# Patient Record
Sex: Female | Born: 1960 | Race: White | Hispanic: No | Marital: Single | Smoking: Never smoker
Health system: Southern US, Community
[De-identification: ages and names within clinical notes are randomized; demographics above are authoritative.]

## PROBLEM LIST (undated history)

## (undated) DIAGNOSIS — K219 Gastro-esophageal reflux disease without esophagitis: Secondary | ICD-10-CM

## (undated) HISTORY — PX: ABDOMINAL HYSTERECTOMY: SHX81

---

## 2016-06-10 ENCOUNTER — Emergency Department: Payer: 59

## 2016-06-10 ENCOUNTER — Observation Stay
Admission: EM | Admit: 2016-06-10 | Discharge: 2016-06-11 | Disposition: A | Discharge status: Home / Self Care | Payer: 59 | Attending: Internal Medicine | Admitting: Internal Medicine

## 2016-06-10 ENCOUNTER — Encounter: Payer: Self-pay | Admitting: *Deleted

## 2016-06-10 DIAGNOSIS — E785 Hyperlipidemia, unspecified: Secondary | ICD-10-CM | POA: Insufficient documentation

## 2016-06-10 DIAGNOSIS — G43909 Migraine, unspecified, not intractable, without status migrainosus: Secondary | ICD-10-CM

## 2016-06-10 DIAGNOSIS — Z79899 Other long term (current) drug therapy: Secondary | ICD-10-CM | POA: Insufficient documentation

## 2016-06-10 DIAGNOSIS — K219 Gastro-esophageal reflux disease without esophagitis: Secondary | ICD-10-CM | POA: Insufficient documentation

## 2016-06-10 DIAGNOSIS — D72829 Elevated white blood cell count, unspecified: Secondary | ICD-10-CM | POA: Diagnosis not present

## 2016-06-10 DIAGNOSIS — I160 Hypertensive urgency: Secondary | ICD-10-CM

## 2016-06-10 DIAGNOSIS — Z7951 Long term (current) use of inhaled steroids: Secondary | ICD-10-CM | POA: Insufficient documentation

## 2016-06-10 DIAGNOSIS — I674 Hypertensive encephalopathy: Secondary | ICD-10-CM | POA: Diagnosis not present

## 2016-06-10 DIAGNOSIS — G459 Transient cerebral ischemic attack, unspecified: Secondary | ICD-10-CM

## 2016-06-10 DIAGNOSIS — R471 Dysarthria and anarthria: Secondary | ICD-10-CM | POA: Diagnosis not present

## 2016-06-10 DIAGNOSIS — I10 Essential (primary) hypertension: Secondary | ICD-10-CM | POA: Diagnosis present

## 2016-06-10 HISTORY — DX: Gastro-esophageal reflux disease without esophagitis: K21.9

## 2016-06-10 LAB — URINALYSIS COMPLETE WITH MICROSCOPIC (ARMC ONLY)
Bilirubin Urine: NEGATIVE
GLUCOSE, UA: NEGATIVE mg/dL
Hgb urine dipstick: NEGATIVE
KETONES UR: NEGATIVE mg/dL
Leukocytes, UA: NEGATIVE
NITRITE: NEGATIVE
Protein, ur: NEGATIVE mg/dL
SPECIFIC GRAVITY, URINE: 1.008 (ref 1.005–1.030)
pH: 6 (ref 5.0–8.0)

## 2016-06-10 LAB — CBC
HCT: 42 % (ref 35.0–47.0)
Hemoglobin: 14.5 g/dL (ref 12.0–16.0)
MCH: 30.6 pg (ref 26.0–34.0)
MCHC: 34.5 g/dL (ref 32.0–36.0)
MCV: 88.7 fL (ref 80.0–100.0)
Platelets: 249 10*3/uL (ref 150–440)
RBC: 4.73 MIL/uL (ref 3.80–5.20)
RDW: 13.2 % (ref 11.5–14.5)
WBC: 14 10*3/uL — ABNORMAL HIGH (ref 3.6–11.0)

## 2016-06-10 LAB — BASIC METABOLIC PANEL
Anion gap: 8 (ref 5–15)
BUN: 12 mg/dL (ref 6–20)
CALCIUM: 9 mg/dL (ref 8.9–10.3)
CO2: 24 mmol/L (ref 22–32)
Chloride: 108 mmol/L (ref 101–111)
Creatinine, Ser: 0.58 mg/dL (ref 0.44–1.00)
GFR calc Af Amer: >60 mL/min (ref >60)
GLUCOSE: 89 mg/dL (ref 65–99)
Potassium: 3.7 mmol/L (ref 3.5–5.1)
Sodium: 140 mmol/L (ref 135–145)

## 2016-06-10 LAB — GLUCOSE, CAPILLARY: GLUCOSE-CAPILLARY: 85 mg/dL (ref 65–99)

## 2016-06-10 MED ORDER — ACETAMINOPHEN 325 MG PO TABS
650.0000 mg | ORAL_TABLET | Freq: Four times a day (QID) | ORAL | Status: DC | PRN
  Administered 2016-06-10: 650 mg via ORAL
  Filled 2016-06-10: qty 2

## 2016-06-10 MED ORDER — HYDRALAZINE HCL 50 MG PO TABS
25.0000 mg | ORAL_TABLET | Freq: Three times a day (TID) | ORAL | Status: DC
  Administered 2016-06-10 – 2016-06-11 (×2): 25 mg via ORAL
  Filled 2016-06-10 (×2): qty 1

## 2016-06-10 MED ORDER — DOCUSATE SODIUM 100 MG PO CAPS
100.0000 mg | ORAL_CAPSULE | Freq: Two times a day (BID) | ORAL | Status: DC
  Administered 2016-06-11: 09:00:00 100 mg via ORAL
  Filled 2016-06-10 (×2): qty 1

## 2016-06-10 MED ORDER — STROKE: EARLY STAGES OF RECOVERY BOOK
Freq: Once | Status: AC
  Administered 2016-06-11: 1
  Administered 2016-06-11: 07:00:00

## 2016-06-10 MED ORDER — TRAZODONE HCL 50 MG PO TABS: 25.0000 mg | ORAL_TABLET | Freq: Every evening | ORAL | Status: DC | PRN

## 2016-06-10 MED ORDER — LABETALOL HCL 5 MG/ML IV SOLN
10.0000 mg | Freq: Once | INTRAVENOUS | Status: AC
  Administered 2016-06-10: 10 mg via INTRAVENOUS
  Filled 2016-06-10: qty 4

## 2016-06-10 MED ORDER — FLUTICASONE PROPIONATE 50 MCG/ACT NA SUSP
2.0000 | Freq: Every day | NASAL | Status: DC
  Administered 2016-06-11: 2 via NASAL
  Filled 2016-06-10: qty 16

## 2016-06-10 MED ORDER — AMLODIPINE BESYLATE 5 MG PO TABS
5.0000 mg | ORAL_TABLET | Freq: Every day | ORAL | Status: DC
  Administered 2016-06-11: 09:00:00 5 mg via ORAL
  Filled 2016-06-10: qty 1

## 2016-06-10 MED ORDER — BISACODYL 5 MG PO TBEC: 5.0000 mg | DELAYED_RELEASE_TABLET | Freq: Every day | ORAL | Status: DC | PRN

## 2016-06-10 MED ORDER — ONDANSETRON HCL 4 MG PO TABS: 4.0000 mg | ORAL_TABLET | Freq: Four times a day (QID) | ORAL | Status: DC | PRN

## 2016-06-10 MED ORDER — HYDROCODONE-ACETAMINOPHEN 5-325 MG PO TABS: 1.0000 | ORAL_TABLET | ORAL | Status: DC | PRN

## 2016-06-10 MED ORDER — ONDANSETRON HCL 4 MG/2ML IJ SOLN: 4.0000 mg | Freq: Four times a day (QID) | INTRAMUSCULAR | Status: DC | PRN

## 2016-06-10 MED ORDER — ACETAMINOPHEN 650 MG RE SUPP: 650.0000 mg | Freq: Four times a day (QID) | RECTAL | Status: DC | PRN

## 2016-06-10 MED ORDER — FAMOTIDINE 20 MG PO TABS
10.0000 mg | ORAL_TABLET | Freq: Every day | ORAL | Status: DC
  Administered 2016-06-11: 09:00:00 10 mg via ORAL
  Filled 2016-06-10: qty 1

## 2016-06-10 MED ORDER — HEPARIN SODIUM (PORCINE) 5000 UNIT/ML IJ SOLN
5000.0000 [IU] | Freq: Three times a day (TID) | INTRAMUSCULAR | Status: DC
  Administered 2016-06-10 – 2016-06-11 (×2): 5000 [IU] via SUBCUTANEOUS
  Filled 2016-06-10 (×2): qty 1

## 2016-06-10 NOTE — H&P (Signed)
[facility] [place] [facility]   PATIENT NAME: Margaret Griffin Griffin    MR#:  [id number]  DATE OF BIRTH:  06/02/1961  DATE OF ADMISSION:  06/10/2016  [facility]   REQUESTING/REFERRING PHYSICIAN: Dr. Cyril LoosenKinner  CHIEF COMPLAINT:   Chief Complaint  Patient presents with  . Hypertension    HISTORY OF PRESENT ILLNESS:  Margaret Griffin  is a 55 y.o. female with no  Known medical problem comes because of dysarthria.bp was high at home with sbp more than 200/110.she felt very hot. Because of high blood pressure patient's father called EMS. Patient denies any blurred vision, weakness of hands or legs. She got a steroid injection to the neck problems on now Thursday. At that time the blood pressure was 160/80 as per records. Because of elevated blood pressures,  ER physician asked me to admit for possible TIA.   Marland Kitchen.PAST MEDICAL HISTORY:  No past medical history on file.  PAST SURGICAL HISTOIRY:  No past surgical history on file.  SOCIAL HISTORY:   Social History  Substance Use Topics  . Smoking status: Not on file  . Smokeless tobacco: Not on file  . Alcohol use Not on file    FAMILY HISTORY:  No family history on file.  DRUG ALLERGIES:  No Known Allergies  REVIEW OF SYSTEMS:  CONSTITUTIONAL: No fever, fatigue or weakness.  EYES: No blurred or double vision.  EARS, NOSE, AND THROAT: No tinnitus or ear pain.  RESPIRATORY: No cough, shortness of breath, wheezing or hemoptysis.  CARDIOVASCULAR: No chest pain, orthopnea, edema.  GASTROINTESTINAL: No nausea, vomiting, diarrhea or abdominal pain.  GENITOURINARY: No dysuria, hematuria.  ENDOCRINE: No polyuria, nocturia,  HEMATOLOGY: No anemia, easy bruising or bleeding SKIN: No rash or lesion. MUSCULOSKELETAL: No joint pain or arthritis.   NEUROLOGIC: No tingling, numbness, weakness. And noted to have muffled voice as per family PSYCHIATRY: No anxiety or  depression.   MEDICATIONS AT HOME:   Prior to Admission medications   Medication Sig Start Date End Date Taking? Authorizing Provider  fluticasone (FLONASE) 50 MCG/ACT nasal spray Place 2 sprays into the nose daily.   Yes Historical Provider, MD  loratadine (CLARITIN) 10 MG tablet Take 1 tablet by mouth daily.   Yes Historical Provider, MD  MULTIPLE VITAMIN PO Take 1 tablet by mouth daily.   Yes Historical Provider, MD  ranitidine (ZANTAC) 150 MG tablet Take 1 tablet by mouth 2 (two) times daily. 04/11/15  Yes Historical Provider, MD      VITAL SIGNS:  Blood pressure (!) 161/88, pulse 87, temperature 98.3 F (36.8 C), resp. rate (!) 21, height 5\' 7"  (1.702 m), weight 83.5 kg (184 lb), SpO2 96 %.  PHYSICAL EXAMINATION:  GENERAL:  55 y.o.-year-old patient lying in the bed with no acute distress.  EYES: Pupils equal, round, reactive to light and accommodation. No scleral icterus. Extraocular muscles intact.  HEENT: Head atraumatic, normocephalic. Oropharynx and nasopharynx clear.  NECK:  Supple, no jugular venous distention. No thyroid enlargement, no tenderness.  LUNGS: Normal breath sounds bilaterally, no wheezing, rales,rhonchi or crepitation. No use of accessory muscles of respiration.  CARDIOVASCULAR: S1, S2 normal. No murmurs, rubs, or gallops.  ABDOMEN: Soft, nontender, nondistended. Bowel sounds present. No organomegaly or mass.  EXTREMITIES: No pedal edema, cyanosis, or clubbing.  NEUROLOGIC: Cranial nerves II through XII are intact. Muscle strength 5/5 in all extremities. Sensation intact. Gait not checked.  PSYCHIATRIC: The patient is alert and oriented x  3.  SKIN: No obvious rash, lesion, or ulcer.   LABORATORY PANEL:   CBC  Recent Labs Lab 06/10/16 1503  WBC 14.0*  HGB 14.5  HCT 42.0  PLT 249   ------------------------------------------------------------------------------------------------------------------  Chemistries   Recent Labs Lab 06/10/16 1503  NA 140   K 3.7  CL 108  CO2 24  GLUCOSE 89  BUN 12  CREATININE 0.58  CALCIUM 9.0   ------------------------------------------------------------------------------------------------------------------  Cardiac Enzymes No results for input(s): TROPONINI in the last 168 hours. ------------------------------------------------------------------------------------------------------------------  RADIOLOGY:  Ct Head Wo Contrast  Result Date: 06/10/2016 CLINICAL DATA:  Lethargy and weakness EXAM: CT HEAD WITHOUT CONTRAST TECHNIQUE: Contiguous axial images were obtained from the base of the skull through the vertex without intravenous contrast. COMPARISON:  None. FINDINGS: Brain: No evidence of acute infarction, hemorrhage, hydrocephalus, extra-axial collection or mass lesion/mass effect. Vascular: No hyperdense vessel or unexpected calcification. Skull: Normal. Negative for fracture or focal lesion. Sinuses/Orbits: No acute finding. Other: None. IMPRESSION: 1. Normal brain.  No acute intracranial abnormalities. Electronically Signed   By: Signa Kellaylor  Stroud M.D.   On: 06/10/2016 16:49    EKG:   Orders placed or performed during the hospital encounter of 06/10/16  . ED EKG  . ED EKG  . EKG 12-Lead  . EKG 12-Lead   Normal sinus rhythm ST-T changes. 74 bpm.  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 #2 malignant hypertension: Patient is started on Norvasc and hydralazine. MRI of the brain is negative possible discharge tomorrow. Hold off on the echo, carotid ultrasound. I feel that her symptoms are  Due to elevated blood pressure rather than TIA or stroke. #3 dysarthria: Likely secondary to malignant hypertension: Treated. Is better, patient to speech also is a improving.  D/w pt and parents  All  the records are reviewed and case discussed with ED provider. Management plans discussed with the patient, family and they are in agreement.  CODE STATUS:full  TOTAL TIME TAKING CARE OF THIS PATIENT: 55 minutes.    Katha HammingKONIDENA,Tallula Grindle M.D on 06/10/2016 at 8:24 PM  Between 7am to 6pm - Pager - [phone]  After 6pm go to [email or web address] - password [email or web address] [facility]  [facility]  Office  [phone]  CC: Primary care physician; [facility]  Note: This dictation was prepared with Dragon dictation along with smaller phrase technology. Any transcriptional errors that result from this process are unintentional.

## 2016-06-10 NOTE — ED Provider Notes (Signed)
[facility] Emergency Department Provider Note   ____________________________________________    I have reviewed the triage vital signs and the nursing notes.   HISTORY  Chief Complaint Hypertension     HPI Estill CottaSharon Ewton is a 55 y.o. female who presents withcomplaints of high blood pressure. Patient reports she felt very "tired and hot" while visiting her parents, they decided to check her blood pressure and found it to be elevated. At that point she became very fatigued and lightheaded briefly so her father called EMS. No chest pain. No focal weakness, no speech difficulty although mother does note that she was whispering at one point. This episode occurred around noon   No past medical history on file.  There are no active problems to display for this patient.   No past surgical history on file.  Prior to Admission medications   Not on File     Allergies Patient has no known allergies.  No family history on file.  Social History Social History  Substance Use Topics  . Smoking status: Not on file  . Smokeless tobacco: Not on file  . Alcohol use Not on file    Review of Systems  Constitutional: No fever/chills Eyes: No visual changes.   Cardiovascular: Denies chest pain. Respiratory: Denies shortness of breath. Gastrointestinal: No abdominal pain.  No nausea, no vomiting.    Musculoskeletal: Recent cortisone injection for chronic neck pain Skin: Negative for rash. Neurological: Negative for headaches or Focal weakness  10-point ROS otherwise negative.  ____________________________________________   PHYSICAL EXAM:  VITAL SIGNS: ED Triage Vitals  Enc Vitals Group     BP 06/10/16 1449 (!) 192/99     Pulse Rate 06/10/16 1449 75     Resp 06/10/16 1449 16     Temp 06/10/16 1449 98.2 F (36.8 C)     Temp Source 06/10/16 1449 Oral     SpO2 06/10/16 1449 97 %     Weight 06/10/16 1451 184 lb (83.5 kg)     Height  06/10/16 1451 5\' 7"  (1.702 m)     Head Circumference --      Peak Flow --      Pain Score --      Pain Loc --      Pain Edu? --      Excl. in GC? --     Constitutional: Alert and oriented. No acute distress.  Eyes: Conjunctivae are normal. PERRLA, EOMI Head: Atraumatic.  Mouth/Throat: Mucous membranes are moist.   Neck:  Painless ROM, no vertebral tenderness to palpation, no meningismus Cardiovascular: Normal rate, regular rhythm. Grossly normal heart sounds.  Good peripheral circulation. Respiratory: Normal respiratory effort.  No retractions. Lungs CTAB. Gastrointestinal: Soft and nontender. No distention.  No CVA tenderness. Genitourinary: deferred Musculoskeletal: No lower extremity tenderness nor edema.  Warm and well perfused Neurologic:  Normal speech and language. No gross focal neurologic deficits are appreciated. No strength in all extremities, equal. Cranial nerves II-12 are normal Skin:  Skin is warm, dry and intact. No rash noted. Psychiatric: Mood and affect are normal. Speech and behavior are normal.  ____________________________________________   LABS (all labs ordered are listed, but only abnormal results are displayed)  Labs Reviewed  CBC - Abnormal; Notable for the following:       Result Value   WBC 14.0 (*)    All other components within normal limits  URINALYSIS COMPLETEWITH MICROSCOPIC ([facility] ONLY) - Abnormal; Notable for the following:    Color,  Urine STRAW (*)    APPearance CLEAR (*)    Bacteria, UA RARE (*)    Squamous Epithelial / LPF 0-5 (*)    All other components within normal limits  BASIC METABOLIC PANEL  GLUCOSE, CAPILLARY  CBG MONITORING, ED   ____________________________________________  EKG  ED ECG REPORT I, Jene EveryKINNER, Laquasha Groome, the attending physician, personally viewed and interpreted this ECG.  Date: 06/10/2016 EKG Time: 3:55 PM  Rate: 74 Rhythm: normal sinus rhythm QRS Axis: normal Intervals: normal ST/T Wave abnormalities:  normal Conduction Disturbances: none Narrative Interpretation: unremarkable  ____________________________________________  RADIOLOGY  CT head unremarkable ____________________________________________   PROCEDURES  Procedure(s) performed: No    Critical Care performed: No ____________________________________________   INITIAL IMPRESSION / ASSESSMENT AND PLAN / ED COURSE  Pertinent labs & imaging results that were available during my care of the patient were reviewed by me and considered in my medical decision making (see chart for details).  Patient well-appearing and in no acute distress. She is neurologically intact. History of present illness is not consistent with stroke rather more consistent with near syncopal episode but TIA possible. EKG is normal. Neurologic exam is benign. We will obtain labs CT head and reevaluate.  Regardless, given complete resolution of symptoms no indication for TPA.  Clinical Course   ----------------------------------------- 6:41 PM on 06/10/2016 -----------------------------------------  Workup is unremarkable. Patient's blood pressure has improved slightly with labetalol IV. However given her description of difficulty speaking feel admission for MRI is most appropriate ____________________________________________   FINAL CLINICAL IMPRESSION(S) / ED DIAGNOSES  Final diagnoses:  Hypertensive urgency  Transient cerebral ischemia, unspecified type      NEW MEDICATIONS STARTED DURING THIS VISIT:  New Prescriptions   No medications on file     Note:  This document was prepared using Dragon voice recognition software and may include unintentional dictation errors.    Jene Everyobert Lusia Greis, MD 06/10/16 [phone]

## 2016-06-10 NOTE — ED Triage Notes (Signed)
Pt arrived via EMS from parent's house with c/o feeling tired and lethargic. When EMS arrived BP was elevated with systolic in the 200s and diastolic in the 100s.  Patient has no past hx of hypertension and is not on any medications. Pt recently had cortisone injection in neck on Wednesday and her BP was elevated then at 160s over 90s..  Pt reports she has periodic headaches and has hx of migraine. Denies any vision changes or headaches. Pt is speaking very softly and is staring frequently. Pt assisted to bathroom and was able to talk and ambulate without difficulty. Family at bedside states around 1300 pt was c/o feeling hot and not long after became lethargic.

## 2016-06-10 NOTE — ED Notes (Signed)
Patient called out asking for something for a headache, paged Dr. Luberta MutterKonidena.

## 2016-06-11 ENCOUNTER — Observation Stay: Payer: 59

## 2016-06-11 DIAGNOSIS — D72829 Elevated white blood cell count, unspecified: Secondary | ICD-10-CM

## 2016-06-11 DIAGNOSIS — G43909 Migraine, unspecified, not intractable, without status migrainosus: Secondary | ICD-10-CM

## 2016-06-11 DIAGNOSIS — I674 Hypertensive encephalopathy: Secondary | ICD-10-CM

## 2016-06-11 DIAGNOSIS — I10 Essential (primary) hypertension: Secondary | ICD-10-CM

## 2016-06-11 DIAGNOSIS — E785 Hyperlipidemia, unspecified: Secondary | ICD-10-CM

## 2016-06-11 LAB — BASIC METABOLIC PANEL
ANION GAP: 8 (ref 5–15)
BUN: 13 mg/dL (ref 6–20)
CO2: 24 mmol/L (ref 22–32)
Calcium: 8.7 mg/dL — ABNORMAL LOW (ref 8.9–10.3)
Chloride: 105 mmol/L (ref 101–111)
Creatinine, Ser: 0.46 mg/dL (ref 0.44–1.00)
GFR calc Af Amer: >60 mL/min (ref >60)
Glucose, Bld: 107 mg/dL — ABNORMAL HIGH (ref 65–99)
POTASSIUM: 3.7 mmol/L (ref 3.5–5.1)
SODIUM: 137 mmol/L (ref 135–145)

## 2016-06-11 LAB — LIPID PANEL
Cholesterol: 186 mg/dL (ref 0–200)
HDL: 49 mg/dL (ref >40)
LDL CALC: 103 mg/dL — AB (ref 0–99)
TRIGLYCERIDES: 172 mg/dL — AB (ref <150)
Total CHOL/HDL Ratio: 3.8 RATIO
VLDL: 34 mg/dL (ref 0–40)

## 2016-06-11 LAB — URINE DRUG SCREEN, QUALITATIVE (ARMC ONLY)
AMPHETAMINES, UR SCREEN: NOT DETECTED
BENZODIAZEPINE, UR SCRN: NOT DETECTED
Barbiturates, Ur Screen: NOT DETECTED
Cannabinoid 50 Ng, Ur ~~LOC~~: NOT DETECTED
Cocaine Metabolite,Ur ~~LOC~~: NOT DETECTED
MDMA (ECSTASY) UR SCREEN: NOT DETECTED
METHADONE SCREEN, URINE: NOT DETECTED
OPIATE, UR SCREEN: NOT DETECTED
PHENCYCLIDINE (PCP) UR S: NOT DETECTED
Tricyclic, Ur Screen: NOT DETECTED

## 2016-06-11 LAB — CBC
HEMATOCRIT: 43.1 % (ref 35.0–47.0)
HEMOGLOBIN: 14.4 g/dL (ref 12.0–16.0)
MCH: 29.8 pg (ref 26.0–34.0)
MCHC: 33.5 g/dL (ref 32.0–36.0)
MCV: 88.9 fL (ref 80.0–100.0)
Platelets: 271 10*3/uL (ref 150–440)
RBC: 4.85 MIL/uL (ref 3.80–5.20)
RDW: 12.8 % (ref 11.5–14.5)
WBC: 13.7 10*3/uL — AB (ref 3.6–11.0)

## 2016-06-11 LAB — GLUCOSE, CAPILLARY: GLUCOSE-CAPILLARY: 115 mg/dL — AB (ref 65–99)

## 2016-06-11 LAB — TROPONIN I: Troponin I: <0.03 ng/mL (ref <0.03)

## 2016-06-11 MED ORDER — AMLODIPINE BESYLATE 5 MG PO TABS
5.0000 mg | ORAL_TABLET | Freq: Once | ORAL | Status: AC
  Administered 2016-06-11: 5 mg via ORAL
  Filled 2016-06-11: qty 1

## 2016-06-11 MED ORDER — AMLODIPINE BESYLATE 10 MG PO TABS: 10.0000 mg | ORAL_TABLET | Freq: Every day | ORAL | Status: DC

## 2016-06-11 MED ORDER — SODIUM CHLORIDE 0.9% FLUSH: 3.0000 mL | INTRAVENOUS | Status: DC | PRN

## 2016-06-11 MED ORDER — AMLODIPINE BESYLATE 10 MG PO TABS
10.0000 mg | ORAL_TABLET | Freq: Every day | ORAL | 5 refills | Status: AC
Start: 2016-06-12 — End: ?

## 2016-06-11 MED ORDER — SODIUM CHLORIDE 0.9% FLUSH
3.0000 mL | Freq: Two times a day (BID) | INTRAVENOUS | Status: DC
Start: 2016-06-11 — End: 2016-06-11
  Administered 2016-06-11: 09:00:00 3 mL via INTRAVENOUS

## 2016-06-11 NOTE — Progress Notes (Signed)
Dr. Winona LegatoVaickute in with MRI order reviewed- will change to routine and MRI staff to be called and notified per MD. TC to MRI tech on call and advised- will be in this morning.  Pt and family updated.

## 2016-06-11 NOTE — Progress Notes (Signed)
MRI negative; carotid US done, troponin's stable/unchanged. Additional norvasc 5 mg po given with med education done. Oral and written hypertension, heart healthy diet, exercise education done stressing signs/symptoms to report, ER interventions done with stated understanding/teach back with sister and mother involved. Oral and written AVS done with pt and sister, norvasc rx given to pt.  Discharged home to self care in care of sister.

## 2016-06-11 NOTE — Discharge Instructions (Signed)
Hypertension Hypertension, commonly called high blood pressure, is when the force of blood pumping through your arteries is too strong. Your arteries are the blood vessels that carry blood from your heart throughout your body. A blood pressure reading consists of a higher number over a lower number, such as 110/72. The higher number (systolic) is the pressure inside your arteries when your heart pumps. The lower number (diastolic) is the pressure inside your arteries when your heart relaxes. Ideally you want your blood pressure below 120/80. Hypertension forces your heart to work harder to pump blood. Your arteries may become narrow or stiff. Having untreated or uncontrolled hypertension can cause heart attack, stroke, kidney disease, and other problems. RISK FACTORS Some risk factors for high blood pressure are controllable. Others are not.  Risk factors you cannot control include:   Race. You may be at higher risk if you are African American.  Age. Risk increases with age.  Gender. Men are at higher risk than women before age 45 years. After age 65, women are at higher risk than men. Risk factors you can control include:  Not getting enough exercise or physical activity.  Being overweight.  Getting too much fat, sugar, calories, or salt in your diet.  Drinking too much alcohol. SIGNS AND SYMPTOMS Hypertension does not usually cause signs or symptoms. Extremely high blood pressure (hypertensive crisis) may cause headache, anxiety, shortness of breath, and nosebleed. DIAGNOSIS To check if you have hypertension, your health care provider will measure your blood pressure while you are seated, with your arm held at the level of your heart. It should be measured at least twice using the same arm. Certain conditions can cause a difference in blood pressure between your right and left arms. A blood pressure reading that is higher than normal on one occasion does not mean that you need treatment. If  it is not clear whether you have high blood pressure, you may be asked to return on a different day to have your blood pressure checked again. Or, you may be asked to monitor your blood pressure at home for 1 or more weeks. TREATMENT Treating high blood pressure includes making lifestyle changes and possibly taking medicine. Living a healthy lifestyle can help lower high blood pressure. You may need to change some of your habits. Lifestyle changes may include:  Following the DASH diet. This diet is high in fruits, vegetables, and whole grains. It is low in salt, red meat, and added sugars.  Keep your sodium intake below 2,300 mg per day.  Getting at least 30-45 minutes of aerobic exercise at least 4 times per week.  Losing weight if necessary.  Not smoking.  Limiting alcoholic beverages.  Learning ways to reduce stress. Your health care provider may prescribe medicine if lifestyle changes are not enough to get your blood pressure under control, and if one of the following is true:  You are 18-59 years of age and your systolic blood pressure is above 140.  You are 60 years of age or older, and your systolic blood pressure is above 150.  Your diastolic blood pressure is above 90.  You have diabetes, and your systolic blood pressure is over 140 or your diastolic blood pressure is over 90.  You have kidney disease and your blood pressure is above 140/90.  You have heart disease and your blood pressure is above 140/90. Your personal target blood pressure may vary depending on your medical conditions, your age, and other factors. HOME CARE INSTRUCTIONS    Have your blood pressure rechecked as directed by your health care provider.   Take medicines only as directed by your health care provider. Follow the directions carefully. Blood pressure medicines must be taken as prescribed. The medicine does not work as well when you skip doses. Skipping doses also puts you at risk for  problems.  Do not smoke.   Monitor your blood pressure at home as directed by your health care provider. SEEK MEDICAL CARE IF:   You think you are having a reaction to medicines taken.  You have recurrent headaches or feel dizzy.  You have swelling in your ankles.  You have trouble with your vision. SEEK IMMEDIATE MEDICAL CARE IF:  You develop a severe headache or confusion.  You have unusual weakness, numbness, or feel faint.  You have severe chest or abdominal pain.  You vomit repeatedly.  You have trouble breathing. MAKE SURE YOU:   Understand these instructions.  Will watch your condition.  Will get help right away if you are not doing well or get worse.   This information is not intended to replace advice given to you by your health care provider. Make sure you discuss any questions you have with your health care provider.   Document Released: 07/17/2005 Document Revised: 12/01/2014 Document Reviewed: 05/09/2013 Elsevier Interactive Patient Education 2016 [employer]  

## 2016-06-11 NOTE — Progress Notes (Signed)
Noted stat MRI not done. TC with voice mail message left for MRI in regards to this and contact nurse with plans. Pt is stable with no co's currently.

## 2016-06-11 NOTE — Discharge Summary (Signed)
[facility] [place] [facility]   PATIENT NAME: Margaret Griffin    MR#:  [id number]  DATE OF BIRTH:  12/07/1960  DATE OF ADMISSION:  06/10/2016 ADMITTING PHYSICIAN: Katha HammingSnehalatha Konidena, MD  DATE OF DISCHARGE: 06/11/2016  1:18 PM  [facility]     ADMISSION DIAGNOSIS:  TIA (transient ischemic attack) [G45.9] Dysarthria [R47.1] Hypertensive urgency [I16.0] Transient cerebral ischemia, unspecified type [G45.9]  DISCHARGE DIAGNOSIS:  Principal Problem:   Dysarthria Active Problems:   Encephalopathy, hypertensive   Malignant essential hypertension   Hyperlipidemia   Migraine   Leukocytosis   SECONDARY DIAGNOSIS:   Past Medical History:  Diagnosis Date  . GERD (gastroesophageal reflux disease)     .pro HOSPITAL COURSE:   The patient is 55 year old Caucasian female with past history significant for history of gastroesophageal reflux disease who presents to the hospital with complaints of confusion, speech disturbance, slurring of speech, feeling hot. She was noted to have marked highly elevated blood pressure of 200/110 and admitted to the hospital. She denied blurry vision, weakness or numbness in her arms or legs. She was admitted to the hospital and initiated on Norvasc with improvement of her blood pressure. With this, her condition improved, including her speech. On the day of discharge she was back to baseline, per family members. While in the hospital patient had carotid ultrasound, unremarkable, but right ICA origin, there was no evidence of left ICA stenosis, antegrade flow was noted in left and the right vertebral arteries. Brain MRI was normal study for age, few punctate white matter foci were felt to be not likely of clinical relevance. Head CT on admission was normal. It was felt that patient did not have TIA or stroke, but rather hypertensive encephalopathy. She was advised to use aspirin  therapy intermittently, especially with her migraine headaches, and she was initiated on Norvasc, which was advanced to 10 mg daily dose. She was advised to follow up with her primary care physician as outpatient within 1 week after discharge . Discussion by problem: #1, dysarthria, confusion, no obvious stroke on MRI of the brain, symptoms resolved with resolution of markedly elevated blood pressure. No other symptoms were reported, making it  unlikely to be TIA, but rather hypertensive encephalopathy. Patient was advised to initiate aspirin therapy, continue blood pressure monitoring. #2. Malignant essential hypertension, resolved with Norvasc, follow-up as outpatient, advanced medications as needed, patient was counseled about sodium intake and exercise #3. Hyperlipidemia, LDL of 103. Patient was advised to lose weight, exercise, and it is recommended. However, to recheck patient's lipid panel as outpatient #4. Leukocytosis, likely stress related, recheck as outpatient, no signs of infection were noted during her stay in the hospital time, urinalysis was normal  , Stable   CONSULTS OBTAINED:    DRUG ALLERGIES:  No Known Allergies  DISCHARGE MEDICATIONS:   Discharge Medication List as of 06/11/2016 12:04 PM    START taking these medications   Details  amLODipine (NORVASC) 10 MG tablet Take 1 tablet (10 mg total) by mouth daily., Starting Mon 06/12/2016, Normal      CONTINUE these medications which have NOT CHANGED   Details  fluticasone (FLONASE) 50 MCG/ACT nasal spray Place 2 sprays into the nose daily., Historical Med    loratadine (CLARITIN) 10 MG tablet Take 1 tablet by mouth daily., Historical Med    MULTIPLE VITAMIN PO Take 1 tablet by mouth daily., Historical Med    ranitidine (ZANTAC) 150 MG tablet  Take 1 tablet by mouth 2 (two) times daily., Starting Sun 04/11/2015, Historical Med         DISCHARGE INSTRUCTIONS:    Patient is to follow-up with primary care physician  within one week after discharge   If you experience worsening of your admission symptoms, develop shortness of breath, life threatening emergency, suicidal or homicidal thoughts you must seek medical attention immediately by calling 911 or calling your MD immediately  if symptoms less severe.  You Must read complete instructions/literature along with all the possible adverse reactions/side effects for all the Medicines you take and that have been prescribed to you. Take any new Medicines after you have completely understood and accept all the possible adverse reactions/side effects.   Please note  You were cared for by a hospitalist during your hospital stay. If you have any questions about your discharge medications or the care you received while you were in the hospital after you are discharged, you can call the unit and asked to speak with the hospitalist on call if the hospitalist that took care of you is not available. Once you are discharged, your primary care physician will handle any further medical issues. Please note that NO REFILLS for any discharge medications will be authorized once you are discharged, as it is imperative that you return to your primary care physician (or establish a relationship with a primary care physician if you do not have one) for your aftercare needs so that they can reassess your need for medications and monitor your lab values.    Today   CHIEF COMPLAINT:   Chief Complaint  Patient presents with  . Hypertension    HISTORY OF PRESENT ILLNESS:  Margaret CottaSharon Griffin  is a 55 y.o. female with a known history of gastroesophageal reflux disease who presents to the hospital with complaints of confusion, speech disturbance, slurring of speech, feeling hot. She was noted to have marked highly elevated blood pressure of 200/110 and admitted to the hospital. She denied blurry vision, weakness or numbness in her arms or legs. She was admitted to the hospital and initiated  on Norvasc with improvement of her blood pressure. With this, her condition improved, including her speech. On the day of discharge she was back to baseline, per family members. While in the hospital patient had carotid ultrasound, unremarkable, but right ICA origin, there was no evidence of left ICA stenosis, antegrade flow was noted in left and the right vertebral arteries. Brain MRI was normal study for age, few punctate white matter foci were felt to be not likely of clinical relevance. Head CT on admission was normal. It was felt that patient did not have TIA or stroke, but rather hypertensive encephalopathy. She was advised to use aspirin therapy intermittently, especially with her migraine headaches, and she was initiated on Norvasc, which was advanced to 10 mg daily dose. She was advised to follow up with her primary care physician as outpatient within 1 week after discharge . Discussion by problem: #1, dysarthria, confusion, no obvious stroke on MRI of the brain, symptoms resolved with resolution of markedly elevated blood pressure. No other symptoms were reported, making it  unlikely to be TIA, but rather hypertensive encephalopathy. Patient was advised to initiate aspirin therapy, continue blood pressure monitoring. #2. Malignant essential hypertension, resolved with Norvasc, follow-up as outpatient, advanced medications as needed, patient was counseled about sodium intake and exercise #3. Hyperlipidemia, LDL of 103. Patient was advised to lose weight, exercise, and it is recommended. However,  to recheck patient's lipid panel as outpatient #4. Leukocytosis, likely stress related, recheck as outpatient, no signs of infection were noted during her stay in the hospital time, urinalysis was normal    VITAL SIGNS:  Blood pressure (!) 156/81, pulse 87, temperature 98 F (36.7 C), temperature source Oral, resp. rate 20, height 5\' 7"  (1.702 m), weight 82.3 kg (181 lb 6.4 oz), SpO2 96 %.  I/O:    Intake/Output Summary (Last 24 hours) at 06/11/16 1351 Last data filed at 06/11/16 0800  Gross per 24 hour  Intake              120 ml  Output                0 ml  Net              120 ml    PHYSICAL EXAMINATION:  GENERAL:  55 y.o.-year-old patient lying in the bed with no acute distress.  EYES: Pupils equal, round, reactive to light and accommodation. No scleral icterus. Extraocular muscles intact.  HEENT: Head atraumatic, normocephalic. Oropharynx and nasopharynx clear.  NECK:  Supple, no jugular venous distention. No thyroid enlargement, no tenderness.  LUNGS: Normal breath sounds bilaterally, no wheezing, rales,rhonchi or crepitation. No use of accessory muscles of respiration.  CARDIOVASCULAR: S1, S2 normal. No murmurs, rubs, or gallops.  ABDOMEN: Soft, non-tender, non-distended. Bowel sounds present. No organomegaly or mass.  EXTREMITIES: No pedal edema, cyanosis, or clubbing.  NEUROLOGIC: Cranial nerves II through XII are intact. Muscle strength 5/5 in all extremities. Sensation intact. Gait not checked.  PSYCHIATRIC: The patient is alert and oriented x 3.  SKIN: No obvious rash, lesion, or ulcer.   DATA REVIEW:   CBC  Recent Labs Lab 06/11/16 0024  WBC 13.7*  HGB 14.4  HCT 43.1  PLT 271    Chemistries   Recent Labs Lab 06/11/16 0024  NA 137  K 3.7  CL 105  CO2 24  GLUCOSE 107*  BUN 13  CREATININE 0.46  CALCIUM 8.7*    Cardiac Enzymes  Recent Labs Lab 06/11/16 1135  TROPONINI <0.03    Microbiology Results  No results found for this or any previous visit.  RADIOLOGY:  Ct Head Wo Contrast  Result Date: 06/10/2016 CLINICAL DATA:  Lethargy and weakness EXAM: CT HEAD WITHOUT CONTRAST TECHNIQUE: Contiguous axial images were obtained from the base of the skull through the vertex without intravenous contrast. COMPARISON:  None. FINDINGS: Brain: No evidence of acute infarction, hemorrhage, hydrocephalus, extra-axial collection or mass lesion/mass  effect. Vascular: No hyperdense vessel or unexpected calcification. Skull: Normal. Negative for fracture or focal lesion. Sinuses/Orbits: No acute finding. Other: None. IMPRESSION: 1. Normal brain.  No acute intracranial abnormalities. Electronically Signed   By: Signa Kell M.D.   On: 06/10/2016 16:49   Mr Brain Wo Contrast  Result Date: 06/11/2016 CLINICAL DATA:  Acute onset of dysarthria.  Hypertension. EXAM: MRI HEAD WITHOUT CONTRAST TECHNIQUE: Multiplanar, multiecho pulse sequences of the brain and surrounding structures were obtained without intravenous contrast. COMPARISON:  Head CT 06/10/2016 FINDINGS: Brain: Diffusion imaging does not show any acute or subacute infarction. The brainstem and cerebellum are normal. Cerebral hemispheres are normal except for a few punctate foci of T2 and FLAIR signal within the frontal white matter, not likely of clinical significance. No cortical or large vessel territory infarction. No mass lesion, hemorrhage, hydrocephalus or extra-axial collection. No pituitary mass. Vascular: Major vessels at the base of the brain show flow. Skull and  upper cervical spine: Negative Sinuses/Orbits: Clear/normal Other: None significant IMPRESSION: Normal study for age. Few punctate white matter foci not likely of clinical relevance. Electronically Signed   By: Paulina Fusi M.D.   On: 06/11/2016 10:57   US Carotid Bilateral  Result Date: 06/11/2016 CLINICAL DATA:  Dysarthria, lethargy, weakness and TIA. Hypertensive. EXAM: BILATERAL CAROTID DUPLEX ULTRASOUND TECHNIQUE: Wallace Cullens scale imaging, color Doppler and duplex ultrasound were performed of bilateral carotid and vertebral arteries in the neck. COMPARISON:  None. FINDINGS: Criteria: Quantification of carotid stenosis is based on velocity parameters that correlate the residual internal carotid diameter with NASCET-based stenosis levels, using the diameter of the distal internal carotid lumen as the denominator for stenosis  measurement. The following velocity measurements were obtained: RIGHT ICA:  79/27 cm/sec CCA:  80/12 cm/sec SYSTOLIC ICA/CCA RATIO:  1.0 DIASTOLIC ICA/CCA RATIO:  2.3 ECA:  120 cm/sec LEFT ICA:  76/26 cm/sec CCA:  89/14 cm/sec SYSTOLIC ICA/CCA RATIO:  0.9 DIASTOLIC ICA/CCA RATIO:  1.9 ECA:  126 cm/sec RIGHT CAROTID ARTERY: Mild amount of partially calcified plaque is present at the level of the carotid bulb and extending just into the right ICA origin. No significant velocity elevation present with estimated right ICA stenosis of less than 50%. RIGHT VERTEBRAL ARTERY: Antegrade flow with normal waveform and velocity. LEFT CAROTID ARTERY: Mild amount of plaque present in the carotid bulb. The left ICA demonstrates no evidence of plaque or stenosis with normal velocities present. There is no evidence of left ICA stenosis. LEFT VERTEBRAL ARTERY: Antegrade flow with normal waveform and velocity. IMPRESSION: Mild plaque in both carotid bulbs and at the right ICA origin. No significant carotid stenosis is identified. Right ICA stenosis is estimated at less than 50%. There is no evidence of left ICA stenosis. Electronically Signed   By: Irish Lack M.D.   On: 06/11/2016 11:03    EKG:   Orders placed or performed during the hospital encounter of 06/10/16  . ED EKG  . ED EKG  . EKG 12-Lead  . EKG 12-Lead      Management plans discussed with the patient, family and they are in agreement.  CODE STATUS:     Code Status Orders        Start     Ordered   06/10/16 2332  Full code  Continuous     06/10/16 2333    Code Status History    Date Active Date Inactive Code Status Order ID Comments User Context   06/10/2016  7:57 PM 06/10/2016 11:33 PM Full Code [id number]  Katha Hamming, MD ED      TOTAL TIME TAKING CARE OF THIS PATIENT: .    Katharina Caper M.D on 06/11/2016 at 1:51 PM  Between 7am to 6pm - Pager - [phone]  After 6pm go to [email or web address] - password [email or web address]  [facility]  [place] [facility]  Office  [phone]  CC: Primary care physician; [facility]

## 2016-06-12 LAB — HEMOGLOBIN A1C
HEMOGLOBIN A1C: 5.6 % (ref 4.8–5.6)
Hgb A1c MFr Bld: 5.6 % (ref 4.8–5.6)
Mean Plasma Glucose: 114 mg/dL
Mean Plasma Glucose: 114 mg/dL

## 2017-10-12 IMAGING — US US CAROTID DUPLEX BILAT
1 series · 13 of 24 positions shown · non-contrast
Comparison: None.

CLINICAL DATA: Dysarthria, lethargy, weakness and TIA.
Hypertensive.

EXAM:
BILATERAL CAROTID DUPLEX ULTRASOUND
TECHNIQUE: Gray scale imaging, color Doppler and duplex ultrasound were
performed of bilateral carotid and vertebral arteries in the neck.

[Series 1: us carotid duplex bilat · 13 of 62 slices shown]
[im 1/62]
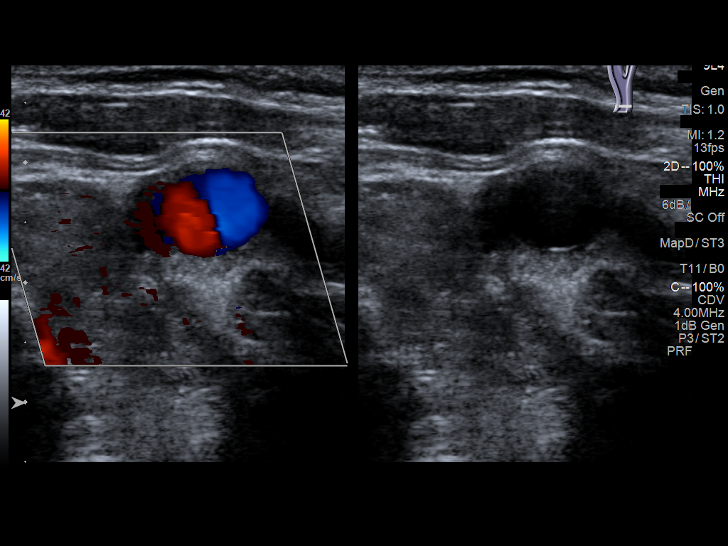
[im 6/62]
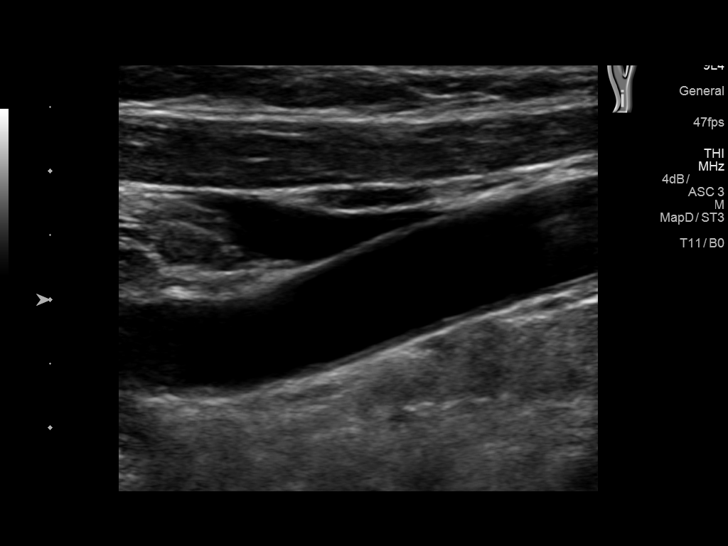
[im 11/62]
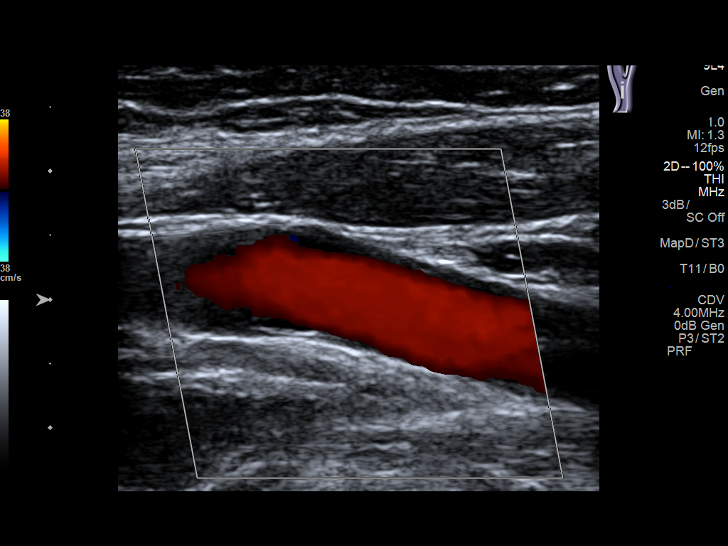
[im 16/62]
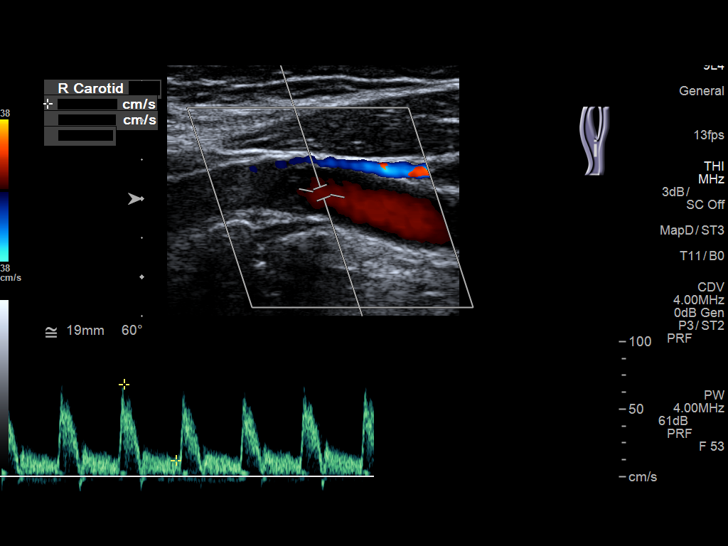
[im 22/62]
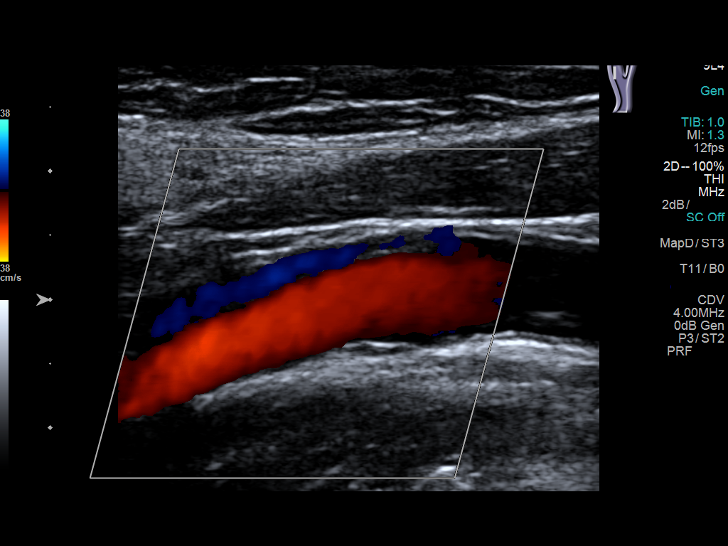
[im 27/62]
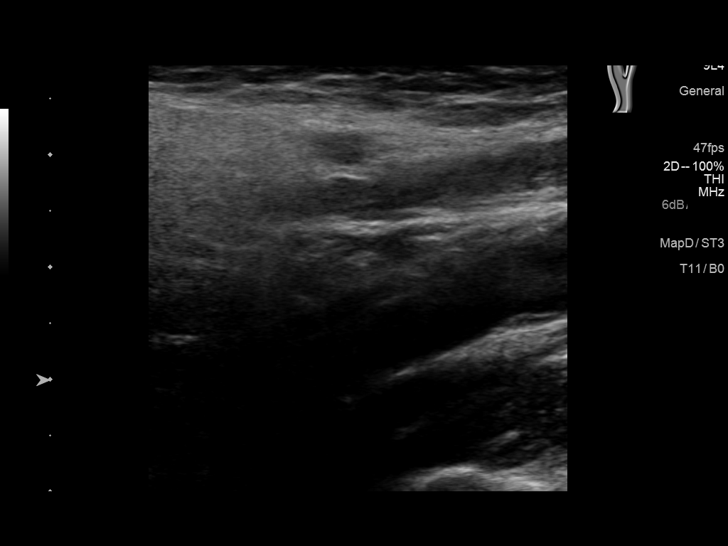
[im 32/62]
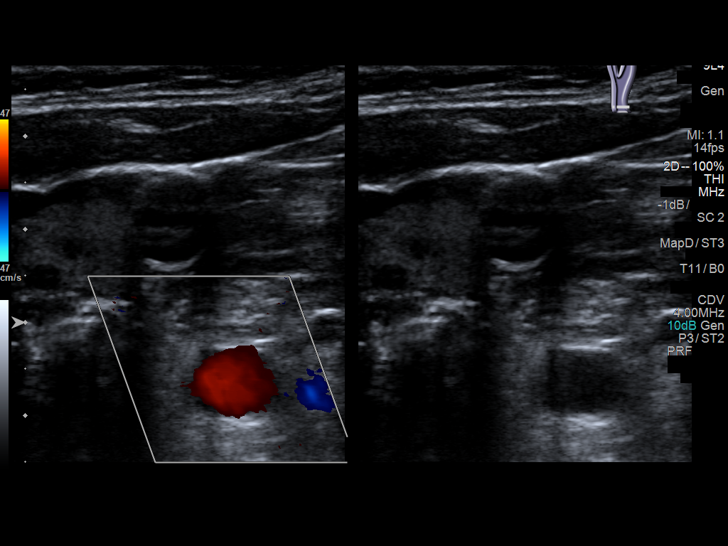
[im 35/62]
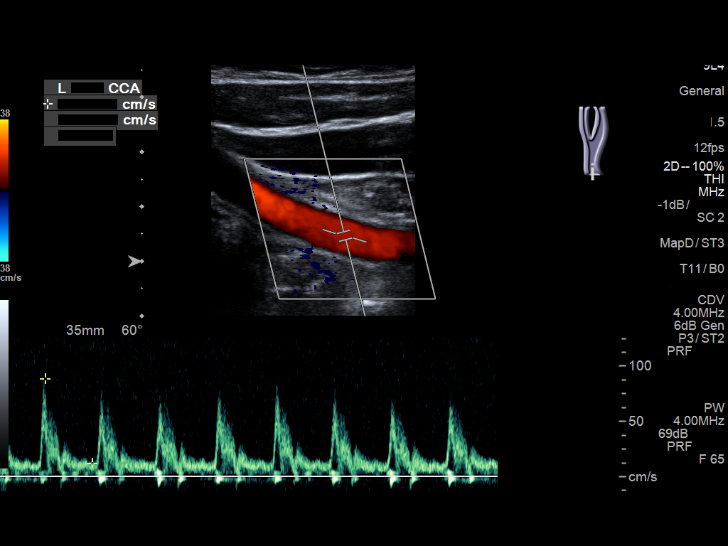
[im 40/62]
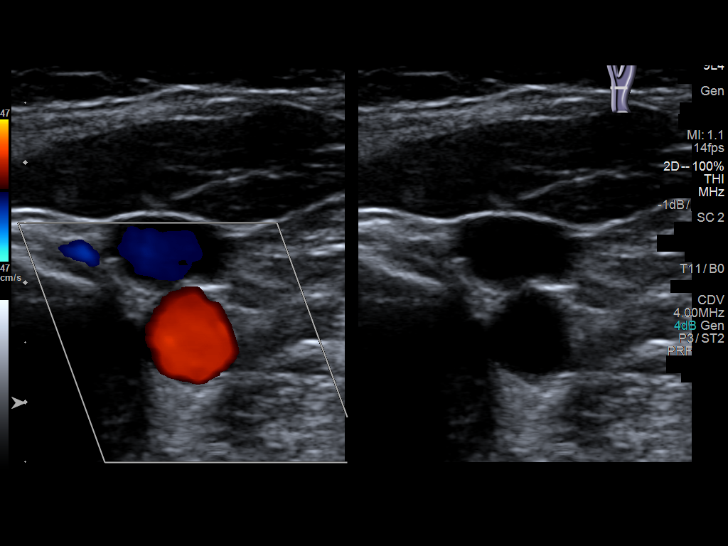
[im 46/62]
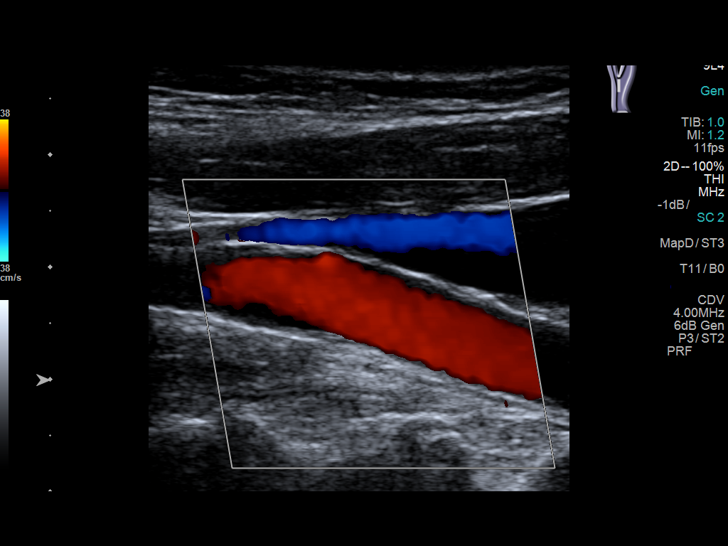
[im 51/62]
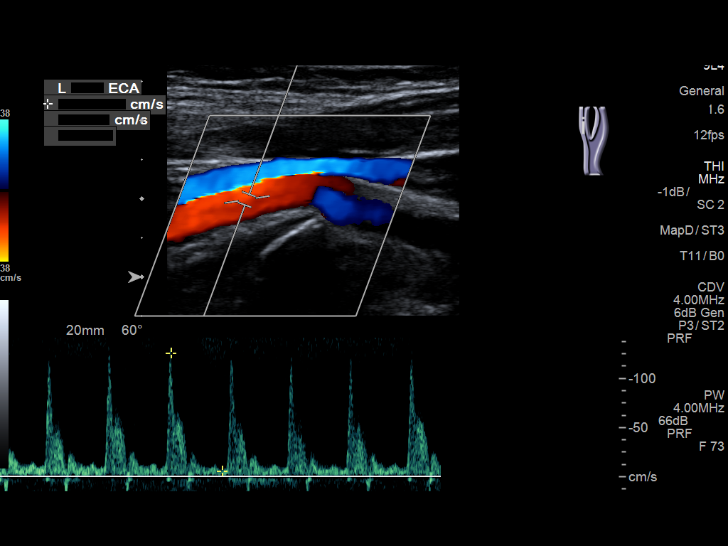
[im 56/62]
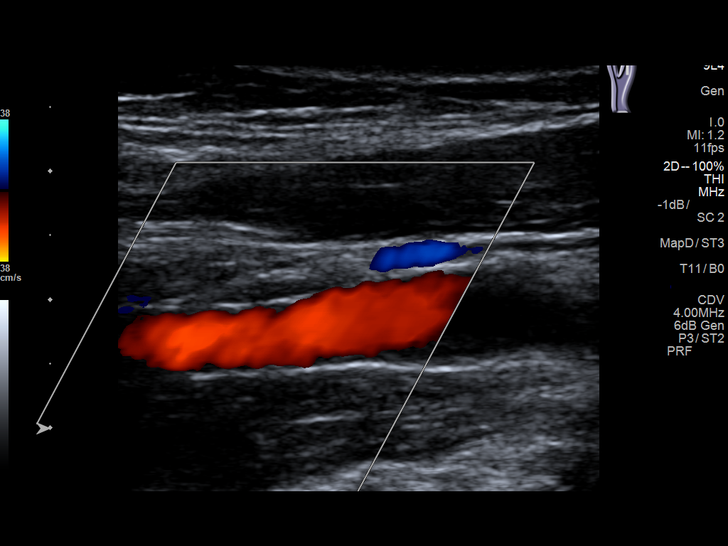
[im 62/62]
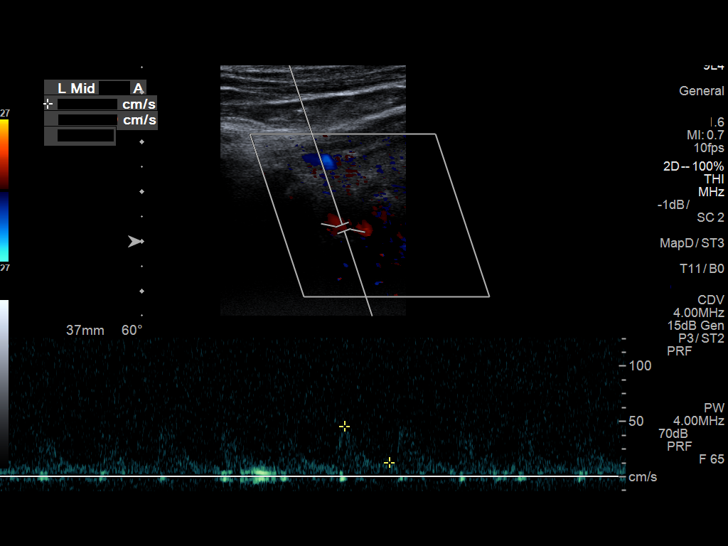

[13 of 24 positions shown; findings below may reference images not displayed]

FINDINGS: Criteria: Quantification of carotid stenosis is based on velocity
parameters that correlate the residual internal carotid diameter
with NASCET-based stenosis levels, using the diameter of the distal
internal carotid lumen as the denominator for stenosis measurement.

The following velocity measurements were obtained:

RIGHT

ICA:  79/27 cm/sec

CCA:  80/12 cm/sec

SYSTOLIC ICA/CCA RATIO:

DIASTOLIC ICA/CCA RATIO:

ECA:  120 cm/sec

LEFT

ICA:  76/26 cm/sec

CCA:  89/14 cm/sec

SYSTOLIC ICA/CCA RATIO:

DIASTOLIC ICA/CCA RATIO:

ECA:  126 cm/sec

RIGHT CAROTID ARTERY: Mild amount of partially calcified plaque is
present at the level of the carotid bulb and extending just into the
right ICA origin. No significant velocity elevation present with
estimated right ICA stenosis of less than 50%.

RIGHT VERTEBRAL ARTERY: Antegrade flow with normal waveform and
velocity.

LEFT CAROTID ARTERY: Mild amount of plaque present in the carotid
bulb. The left ICA demonstrates no evidence of plaque or stenosis
with normal velocities present. There is no evidence of left ICA
stenosis.

LEFT VERTEBRAL ARTERY: Antegrade flow with normal waveform and
velocity.
IMPRESSION: Mild plaque in both carotid bulbs and at the right ICA origin. No
significant carotid stenosis is identified. Right ICA stenosis is
estimated at less than 50%. There is no evidence of left ICA
stenosis.
